# Patient Record
Sex: Male | Born: 2000 | Race: Black or African American | Hispanic: No | Marital: Single | Smoking: Never smoker
Health system: Southern US, Community
[De-identification: ages and names within clinical notes are randomized; demographics above are authoritative.]

## PROBLEM LIST (undated history)

## (undated) DIAGNOSIS — D6101 Constitutional (pure) red blood cell aplasia: Secondary | ICD-10-CM

---
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
## 2022-09-03 ENCOUNTER — Ambulatory Visit (HOSPITAL_COMMUNITY)
Admission: EM | Admit: 2022-09-03 | Discharge: 2022-09-03 | Disposition: A | Discharge status: Home / Self Care | Payer: Medicaid Other | Attending: Family Medicine | Admitting: Family Medicine

## 2022-09-03 ENCOUNTER — Encounter (HOSPITAL_COMMUNITY): Payer: Self-pay

## 2022-09-03 DIAGNOSIS — L02411 Cutaneous abscess of right axilla: Secondary | ICD-10-CM | POA: Diagnosis not present

## 2022-09-03 MED ORDER — CEPHALEXIN 500 MG PO CAPS: 500.0000 mg | ORAL_CAPSULE | Freq: Three times a day (TID) | ORAL | 0 refills | Status: AC

## 2022-09-03 NOTE — ED Provider Notes (Signed)
[facility]    CSN: [id number] Arrival date & time: 09/03/22  3825      History   Chief Complaint Chief Complaint  Patient presents with   Abscess    HPI Ronald Joseph is a 22 y.o. male.   Patient is here for a boil/abscess at the right arm pit x 1 week.   He did try to ice it, and it started leaking several days ago.  It is still draining a bit.  No fevers/chills.        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 No pertinent surgical history.     Home Medications    Prior to Admission medications   Medication Sig Start Date End Date Taking? Authorizing Provider  albuterol (PROVENTIL HFA) 108 (90 Base) MCG/ACT inhaler Inhale 2 puffs into the lungs every 6 (six) hours as needed for wheezing. 10/02/15 10/01/16  Virginia Crews, MD  hydrocortisone (ANUSOL-HC) 2.5 % rectal cream Apply rectally 2 times daily 11/04/17   Rogue Bussing, MD  hydrocortisone sodium succinate (SOLU-CORTEF) 100 MG SOLR injection Give 2 ml (100mg ) in a big muscle (thigh) if unable to tolerate oral dose of stress steroids and take to ER. 10/17/20   [provider]  polyethylene glycol powder (GLYCOLAX/MIRALAX) powder Mix 1 capful a day into beverage of choice, increasing as needed to have 1 soft stool a day. 11/04/17   Rogue Bussing, MD  predniSONE (DELTASONE) 10 MG tablet Take 12 mg daily    [provider]  Spacer/Aero-Holding Chambers (E-Z SPACER) inhaler Use as  instructed 10/02/15   Virginia Crews, MD  triamcinolone cream (KENALOG) 0.1 % Apply 1 application topically 2 (two) times daily. 03/27/16   Rogue Bussing, MD    Family History Family History  Problem Relation Age of Onset   Hypertension Mother    Diabetes Mother     Social History Social History   Tobacco Use   Smoking status: Never   Smokeless tobacco: Never  Vaping Use   Vaping Use: Never used  Substance Use Topics   Alcohol use: No   Drug use: Never     Allergies   Patient has no known allergies.   Review of Systems Review of Systems  Constitutional: Negative.   HENT: Negative.    Respiratory: Negative.    Gastrointestinal: Negative.   Genitourinary: Negative.   Musculoskeletal: Negative.      Physical Exam Triage Vital Signs ED Triage Vitals [09/03/22 1119]  Enc Vitals Group     BP (!) 143/79     Pulse Rate 81     Resp 16     Temp 98.2 F (36.8 C)     Temp Source Oral     SpO2 97 %     Weight      Height      Head Circumference      Peak Flow      Pain Score 6  Pain Loc      Pain Edu?      Excl. in [place]?    No data found.  Updated Vital Signs BP (!) 143/79 (BP Location: Left Arm)   Pulse 81   Temp 98.2 F (36.8 C) (Oral)   Resp 16   SpO2 97%   Visual Acuity Right Eye Distance:   Left Eye Distance:   Bilateral Distance:    Right Eye Near:   Left Eye Near:    Bilateral Near:     Physical Exam Constitutional:      Appearance: Normal appearance.  Cardiovascular:     Rate and Rhythm: Normal rate.  Pulmonary:     Effort: Pulmonary effort is normal.  Skin:    Comments: At the right axilla is a small, slightly raised pimple like lesion that is open and draining slightly.  Only minimal fullness, mildly tender  Neurological:     General: No focal deficit present.     Mental Status: He is alert.  Psychiatric:        Mood and Affect: Mood normal.      UC Treatments / Results  Labs (all labs ordered are listed,  but only abnormal results are displayed) Labs Reviewed - No data to display  EKG   Radiology No results found.  Procedures Procedures (including critical care time)  Medications Ordered in UC Medications - No data to display  Initial Impression / Assessment and Plan / UC Course  I have reviewed the triage vital signs and the nursing notes.  Pertinent labs & imaging results that were available during my care of the patient were reviewed by me and considered in my medical decision making (see chart for details).   Final Clinical Impressions(s) / UC Diagnoses   Final diagnoses:  Abscess of axilla, right     Discharge Instructions      You were seen today for an abscess.  It has opened and drained on its own.  I have sent out keflex three times/day x 7 days to treat this further.  Please keep the area covered while it continues to drain.  Avoid wearing any deodorant while it is still open and draining.     ED Prescriptions     Medication Sig Dispense Auth. Provider   cephALEXin (KEFLEX) 500 MG capsule Take 1 capsule (500 mg total) by mouth 3 (three) times daily for 7 days. 21 capsule Rondel Oh, MD      PDMP not reviewed this encounter.   Rondel Oh, MD 09/03/22 1154

## 2022-09-03 NOTE — ED Triage Notes (Signed)
Patient reports a right axillary abscess x 1 week.  Patient states he used ice to the right axillary area.

## 2022-09-03 NOTE — Discharge Instructions (Signed)
You were seen today for an abscess.  It has opened and drained on its own.  I have sent out keflex three times/day x 7 days to treat this further.  Please keep the area covered while it continues to drain.  Avoid wearing any deodorant while it is still open and draining.

## 2023-04-29 ENCOUNTER — Ambulatory Visit
Admission: EM | Admit: 2023-04-29 | Discharge: 2023-04-29 | Disposition: A | Discharge status: Home / Self Care | Payer: MEDICAID | Attending: Internal Medicine | Admitting: Internal Medicine

## 2023-04-29 DIAGNOSIS — K122 Cellulitis and abscess of mouth: Secondary | ICD-10-CM

## 2023-04-29 MED ORDER — AMOXICILLIN-POT CLAVULANATE 875-125 MG PO TABS
1.0000 | ORAL_TABLET | Freq: Two times a day (BID) | ORAL | 0 refills | Status: DC
Start: 2023-04-29 — End: 2024-04-19

## 2023-04-29 MED ORDER — IBUPROFEN 800 MG PO TABS
800.0000 mg | ORAL_TABLET | Freq: Three times a day (TID) | ORAL | 0 refills | Status: DC
Start: 2023-04-29 — End: 2024-04-19

## 2023-04-29 NOTE — Discharge Instructions (Addendum)
[facility] [place] Address: [address] [place], [place], [place] [address] Phone: [phone]  [facility] [phone] extension [phone] [address]  Dr. Lawrence Marseilles [phone] [address]  [place] [phone] [address] [facility].  [facility] [phone] extension [address] [place], [place], [address] First come first serve for the first 10 clients.  May do simple extractions only, no wisdom teeth or surgery.  You may try the second for Thursday of the month starting at 6:30 AM.  [facility] You may call the school to see if they are still helping to provide dental care for emergent cases.

## 2023-04-29 NOTE — ED Provider Notes (Signed)
[facility]  Note:  This document was prepared using Conservation officer, historic buildings and may include unintentional dictation errors.  MRN: [id number] DOB: [birth year]  Subjective:   Ronald Joseph is a 22 y.o. male presenting for 2-day history of acute onset left-sided facial pain, facial swelling.  Patient has tried to squeeze the area to express it.  He has a history of undergoing braces but has not followed up with a dental specialist.  No history of dental abscess.  No current facility-administered medications for this encounter.  Current Outpatient Medications:    albuterol (PROVENTIL HFA) 108 (90 Base) MCG/ACT inhaler, Inhale 2 puffs into the lungs every 6 (six) hours as needed for wheezing., Disp: 1 Inhaler, Rfl: 6   predniSONE (DELTASONE) 10 MG tablet, Take 12 mg daily, Disp: , Rfl:    Spacer/Aero-Holding Chambers (E-Z SPACER) inhaler, Use as instructed, Disp: 1 each, Rfl: 2   No Known Allergies  Past Medical History:  Diagnosis Date   Diamond-Blackfan anemia ([facility])      History reviewed. No pertinent surgical history.  Family History  Problem Relation Age of Onset   Hypertension Mother    Diabetes Mother     Social History   Tobacco Use   Smoking status: Never   Smokeless tobacco: Never  Vaping Use   Vaping status: Never Used  Substance Use Topics   Alcohol use: No   Drug use: Never    ROS   Objective:   Vitals: BP 138/88 (BP Location: Right Arm)   Pulse 66   Temp 98 F (36.7 C) (Oral)   Resp 16   SpO2 97%   Physical Exam Constitutional:      General: He is not in acute distress.    Appearance: Normal appearance. He is well-developed and normal weight. He is not ill-appearing, toxic-appearing or diaphoretic.  HENT:     Head: Normocephalic and atraumatic.      Right Ear: External ear normal.     Left Ear: External ear normal.     Nose: Nose normal.     Mouth/Throat:     Dentition: Gingival swelling present.      Pharynx: Oropharynx is clear.  Eyes:     General: No scleral icterus.       Right eye: No discharge.        Left eye: No discharge.     Extraocular Movements: Extraocular movements intact.  Cardiovascular:     Rate and Rhythm: Normal rate.  Pulmonary:     Effort: Pulmonary effort is normal.  Musculoskeletal:     Cervical back: Normal range of motion.  Neurological:     Mental Status: He is alert and oriented to person, place, and time.  Psychiatric:        Mood and Affect: Mood normal.        Behavior: Behavior normal.        Thought Content: Thought content normal.        Judgment: Judgment normal.     Assessment and Plan :   PDMP not reviewed this encounter.  1. Oral infection    Start Augmentin for dental/oral infection and abscess, use ibuprofen for pain and inflammation. Emphasized need for dental surgeon consult. Counseled patient on potential for adverse effects with medications prescribed/recommended today, strict ER and return-to-clinic precautions discussed, patient verbalized understanding.    [place]is Bamberg, [place] 04/29/23 1825

## 2023-04-29 NOTE — ED Triage Notes (Signed)
Pt reports he has a bump in the left side of the mouth x 2 days. Reports he has swelling and pain in the left sided of mouth. Pt thought it was a pimple and he tried to squeezed.

## 2024-04-19 ENCOUNTER — Ambulatory Visit: Payer: MEDICAID

## 2024-04-19 VITALS — BP 138/88 | HR 73 | Ht 64.0 in | Wt 131.8 lb

## 2024-04-19 DIAGNOSIS — F840 Autistic disorder: Secondary | ICD-10-CM | POA: Diagnosis not present

## 2024-04-19 DIAGNOSIS — Z0000 Encounter for general adult medical examination without abnormal findings: Secondary | ICD-10-CM

## 2024-04-19 MED ORDER — KETOCONAZOLE 2 % EX SHAM: 1.0000 | MEDICATED_SHAMPOO | CUTANEOUS | 0 refills | Status: AC

## 2024-04-19 NOTE — Patient Instructions (Addendum)
 It was wonderful to see you today.  Today we talked about:  Your dry scalp. I have sent in Ketoconazole  shampoo to your pharmacy. Lather and leave on your scalp for 5 minutes then rinse off. Use until dandruff is gone.   ASD resources and therapy resources- see below.  Please get your lab work collected here. I will call with results.   Please sign up for MyChart for prompt communication with me. [email or web address] [phone]   See me back in 1 year or sooner if needed!  Thank you for choosing [facility].   Please call [phone] with any questions about today's appointment.  Please arrive at least 15 minutes prior to your scheduled appointments.   If you need additional refills before your next appointment, please call your pharmacy first.   You should follow up in our clinic in 1 year.  Camie Dixons, DO Family Medicine      [email or web address]/find-[email or web address]  Autism Speaks: Autism Grants for Families (potential funding source) Autism Speaks is dedicated to creating an inclusive world for all individuals with autism throughout their lifespan. We do this through advocacy, services, supports, research and innovation, and advances in care for autistic individuals and their families.  Ensure access to reliable information and services across the lifespan Support research and innovation that drives towards improved quality of life and well-being for individuals with autism throughout their lives Leverage our assets through Sales promotion account executive, partnerships and collaboration to support, extend and convene the work of Counsellor delivery of solutions for adult quality of life needs, including transition, employment, housing and health and wellness Live and promote principles of diversity, equity, access and inclusion both in and outside of the organization   Autism Society of [place] The Autism Society of [place]  improves  the lives of individuals with autism, supports their families, and educates communities.  [facility] Autism Research Registry What is the [facility] Autism Research Registry? The [facility] Autism Research Registry is a voluntary registry open to individuals of all ages, including those who are not on the autism spectrum.When you enroll in our [facility] Autism Research Registry, you are giving the [facility] for Autism permission to contact you about studies for which you or your child may be eligible. How can l join the [employer] Registry?  Enroll online, call [phone], or email [email or web address].  Talk With SpecialistsEmpty heading Autism Resource Specialists connect families to resources and provide training to help you become your child's best advocate. As parents of children with autism, they understand your concerns. Contact one near you:  Dagoberto Regal (English) or Shanda Veronia Morales (English or Spanish) [phone] or [phone]  Direct Services Direct Services support children and adults with autism as they develop the skills to increase self-sufficiency and participate in the community in a meaningful way. Near you, ASNC offers:  Employment supports Skill-building and support Respite through [phone] The [facility] Life [facility] (ACLE) day program for individuals with autism (ages 59 and up) Contact our [place] office: [address] [place], [place] [address] [phone] [email or web address] -[email or web address].[email or web address]  Camp RoyallEmpty heading Dorrie Meiers is the nation's oldest and largest camp for individuals with autism. Located near [place], Dorrie Meiers serves all ages and offers year-round programming. [employer] at [phone] ext. [phone] or [email or web address] -[email or web address].be.  Social RecreationEmpty heading Programs provide opportunities for participants to bond over common interests, practice social skills, and try new activities. Near you, ASNC  offers:  Summer Program for school-age children Weekend Respite "The  Hangout" for children ages 34-22. Siblings are also welcome. The [facility] Life [facility] (ACLE) day program for adults with autism  WorkshopsEmpty heading Workshops with our Autism Resource Specialists are quick, easy ways to learn more about topics that concern you, such as IEPs, transitioning, and residential options. Our Clinical trainers provide comprehensive sessions for professionals and caregivers on topics such as preventing challenging behaviors and functional communication.  Find a Chapter/Support Group Support Groups provide a place for families who face similar challenges to feel understood as they offer each other encouragement. [email or web address] -[email or web address]  For more information about Support Group events and meetups, please see the calendar, contact the Support Group by email, or join the Facebook group.  ASNC Store The Autism Society of [place]  sells a variety of autism awareness, acceptance, and appreciation items through our online store and at various events such as the [employer] for Autism and the annual conference. To shop the latest ASNC-branded styles, awareness T-shirts, and other items, click here.   Therapy and Counseling Resources Most providers on this list will take Medicaid. Patients with commercial insurance or Medicare should contact their insurance company to get a list of in network providers.  [facility] (takes children) Location 1: [address] [place], [place] [address] Location 2: [address] [place], [place] [address] [phone]   [facility] (spanish speaking therapist available)(habla espanol)(take medicare and medicaid)  [address], [place], [place] [address], [place]  [email or web address] [phone]  [facility]iat[facility] Counseling [address]  [place], [place] [address] [phone]  [facility]   [address], [address] [place], [place] [address]      [phone]  [facility] (spanish available) [address]  [place], [place] [address] [phone]  [facility] (take [facility] and medicare) [address], [address]  [place], [place] [address]       [phone]     [facility] (virtual only) [phone]  Janit Griffins [facility] [address], [place], [place] [phone]  [facility]:  [address] [place] [phone]  [facility]:  [address] [place] [phone]  [facility] (under & uninsured) [address]   [place] [phone]    [email or web address]    [facility] [address]  [place]    [phone]  [facility] -[address]     Phone:  [phone]     [facility]-  [address]  [phone]   [facility]      [place], [place] [phone]  [facility]: [address] [place], [place], [place]  [phone]   [facility] (Spanish therapist) [email or web address]  [address]   [place] [address]    [phone]    [facility]   [address],  [place], [place]  [phone]   [facility]  [address] [place]  [phone]  [facility]  [address] [place], [place]        [phone]  Open Access/Walk In Clinic under & uninsured  [facility]  [address] [place], [place] [phone] Crisis [phone]  [facility] the [address],  (Spanish)   [address] , [place]: [phone]) 8:30 - 12; 1 -  2:30  Family Service of the [employer],  [address], [place]     [phone]):8:30 - 12; 2 - 3PM  [facility] [employer],  [address]  [place]; [phone]):   Mon - Fri 8 AM - 5 PM  [facility] [address] [place]  MWF 12:30 to 3:00 or call to schedule an appointment  [phone]  Specific Provider options Psychology Today  [email or web address]  click on find a therapist  enter your zip code left side and select or tailor a therapist for your specific need.   [facility] Provider Directory [email or web address]  (Medicaid)   Follow all drop down to find a provider  Social Support program [facility] [place] [phone] or PhotoSolver.pl [address], [place], [place] Recovery support and educational   24- Hour Availability:   [facility]  [address] [place], [place] [phone] Crisis [phone]  Family Service of the [facility] [phone]  [place] [facility]  [phone]   [facility]  [phone] (after hours)  Therapeutic Alternative/Mobile Crisis   [phone]  [place]  National Suicide Hotline  [phone] MERRILYN)  Call 911 or go to emergency room  [facility]  [phone]);  [place] and [employer]  [phone]); [place], [place], [place], [place], [place], [place], [place]

## 2024-04-19 NOTE — Progress Notes (Addendum)
    SUBJECTIVE:   Chief compliant/HPI: annual examination  Ronald Joseph is a 23 y.o. 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   Mother's main concerns today are finding resources for the patient regarding his ASD and worsening anxiety/depression. The patient graduated high school and has stayed at home for the last 2 years. He does not work or go to school. Mother states he seems more irritable at home, stays in his room all day, sleeps a lot and watches TV. There was an instance of online bullying this year that Mother has blocked and taken care of but she is concerned this has worsened his anxiety/depression.   Patient follows [facility] hematology adult clinic every 6 months for lab work and has bone marrow work up every 3 years.    Mother states the patient is very picky with what he eats. The only greens he will eat are sweet peas and he is strictly a meat eater.   Review of systems form notable for as HPI.    OBJECTIVE:   Ht 5' 4 (1.626 m)   Wt 59.8 kg   BMI 22.62 kg/m   General: A&O, NAD HEENT: No sign of trauma, EOM grossly intact, dry scales to hairline  Cardiac: RRR, no m/r/g Respiratory: CTAB, normal WOB, no w/c/r GI: Soft, NTTP, non-distended  Extremities: no peripheral edema. Neuro: Normal gait, moves all four extremities appropriately. Psych: Appropriate mood and affect   ASSESSMENT/PLAN:   Assessment & Plan Annual physical exam - Sent patient and mother home with extensive list of resources for autism. - Annual labwork: CBC, CMP, lipid panel, TSH/T4 - Follow up with me in 1 year or sooner - Continue to follow with [facility] for your DBA Annual Examination  See AVS for age appropriate recommendations  Blood pressure reviewed and at goal? No, will continue to monitor. Advised diet changes.   Advanced directive? No   Considered the following items based upon USPSTF  recommendations: HIV testing: done 2008 negative Hepatitis C: done 2008 negative Hepatitis B: done 2008 negative Syphilis if at high risk: {not indicated GC/CTnot indicated Lipid panel (nonfasting or fasting) discussed based upon AHA recommendations and ordered.  Consider repeat every 4-6 years.  Reviewed risk factors for latent tuberculosis and not indicated Immunizations UTD   Follow up in 1 year or sooner if indicated.  MyChart Activation: Signed up today  Camie Dixons, DO [place] [facility]

## 2024-04-20 LAB — CMP14+EGFR
ALT: 13 IU/L (ref 0–44)
AST: 13 IU/L (ref 0–40)
Albumin: 4.8 g/dL (ref 4.3–5.2)
Alkaline Phosphatase: 106 IU/L (ref 47–123)
BUN/Creatinine Ratio: 14 (ref 9–20)
BUN: 14 mg/dL (ref 6–20)
Bilirubin Total: 0.6 mg/dL (ref 0.0–1.2)
CO2: 23 mmol/L (ref 20–29)
Calcium: 9.5 mg/dL (ref 8.7–10.2)
Chloride: 102 mmol/L (ref 96–106)
Creatinine, Ser: 0.97 mg/dL (ref 0.76–1.27)
Globulin, Total: 2.6 g/dL (ref 1.5–4.5)
Glucose: 70 mg/dL (ref 70–99)
Potassium: 3.6 mmol/L (ref 3.5–5.2)
Sodium: 139 mmol/L (ref 134–144)
Total Protein: 7.4 g/dL (ref 6.0–8.5)
eGFR: 114 mL/min/1.73 (ref >59)

## 2024-04-20 LAB — LIPID PANEL
Chol/HDL Ratio: 2.9 ratio (ref 0.0–5.0)
Cholesterol, Total: 113 mg/dL (ref 100–199)
HDL: 39 mg/dL — ABNORMAL LOW (ref >39)
LDL Chol Calc (NIH): 55 mg/dL (ref 0–99)
Triglycerides: 98 mg/dL (ref 0–149)
VLDL Cholesterol Cal: 19 mg/dL (ref 5–40)

## 2024-04-20 LAB — COMPREHENSIVE METABOLIC PANEL WITH GFR
ALT: 10 IU/L (ref 0–44)
AST: 14 IU/L (ref 0–40)
Albumin: 4.6 g/dL (ref 4.3–5.2)
Alkaline Phosphatase: 106 IU/L (ref 47–123)
BUN/Creatinine Ratio: 13 (ref 9–20)
BUN: 14 mg/dL (ref 6–20)
Bilirubin Total: 0.7 mg/dL (ref 0.0–1.2)
CO2: 23 mmol/L (ref 20–29)
Calcium: 9.3 mg/dL (ref 8.7–10.2)
Chloride: 103 mmol/L (ref 96–106)
Creatinine, Ser: 1.08 mg/dL (ref 0.76–1.27)
Globulin, Total: 2.8 g/dL (ref 1.5–4.5)
Glucose: 74 mg/dL (ref 70–99)
Potassium: 3.7 mmol/L (ref 3.5–5.2)
Sodium: 141 mmol/L (ref 134–144)
Total Protein: 7.4 g/dL (ref 6.0–8.5)
eGFR: 100 mL/min/1.73 (ref >59)

## 2024-04-20 LAB — TSH: TSH: 2.54 u[IU]/mL (ref 0.450–4.500)

## 2024-04-20 LAB — T4, FREE: Free T4: 1.27 ng/dL (ref 0.82–1.77)

## 2024-04-20 LAB — T4: T4, Total: 6.5 ug/dL (ref 4.5–12.0)

## 2024-04-22 DIAGNOSIS — F840 Autistic disorder: Secondary | ICD-10-CM | POA: Insufficient documentation

## 2024-04-23 ENCOUNTER — Ambulatory Visit: Payer: Self-pay

## 2024-04-23 NOTE — Progress Notes (Signed)
 Spoke to mother Dollie about normal labs, she verbalizes understanding.  Good cholesterol is a little low for an adult male, encouraged exercise to bring that up.
